# Patient Record
Sex: Female | Born: 1975 | Race: White | Hispanic: Yes | Marital: Single | Smoking: Never smoker
Health system: Southern US, Community
[De-identification: ages and names within clinical notes are randomized; demographics above are authoritative.]

## PROBLEM LIST (undated history)

## (undated) HISTORY — PX: TUBAL LIGATION: SHX77

---

## 2010-05-26 ENCOUNTER — Other Ambulatory Visit (HOSPITAL_COMMUNITY): Payer: Self-pay | Admitting: *Deleted

## 2010-05-26 DIAGNOSIS — O09529 Supervision of elderly multigravida, unspecified trimester: Secondary | ICD-10-CM

## 2010-06-03 ENCOUNTER — Ambulatory Visit (HOSPITAL_COMMUNITY): Payer: Self-pay

## 2010-06-09 ENCOUNTER — Ambulatory Visit (HOSPITAL_COMMUNITY)
Admission: RE | Admit: 2010-06-09 | Discharge: 2010-06-09 | Disposition: A | Discharge status: Home / Self Care | Payer: Medicaid Other | Source: Ambulatory Visit | Attending: Family Medicine | Admitting: Family Medicine

## 2010-06-09 DIAGNOSIS — O0930 Supervision of pregnancy with insufficient antenatal care, unspecified trimester: Secondary | ICD-10-CM | POA: Insufficient documentation

## 2010-06-09 DIAGNOSIS — O09529 Supervision of elderly multigravida, unspecified trimester: Secondary | ICD-10-CM | POA: Insufficient documentation

## 2015-01-15 ENCOUNTER — Other Ambulatory Visit: Payer: Self-pay | Admitting: Obstetrics and Gynecology

## 2015-01-15 ENCOUNTER — Other Ambulatory Visit (HOSPITAL_COMMUNITY): Payer: Self-pay | Admitting: Obstetrics and Gynecology

## 2015-01-15 DIAGNOSIS — Z1231 Encounter for screening mammogram for malignant neoplasm of breast: Secondary | ICD-10-CM

## 2015-01-30 ENCOUNTER — Ambulatory Visit (HOSPITAL_COMMUNITY)
Admission: RE | Admit: 2015-01-30 | Discharge: 2015-01-30 | Disposition: A | Discharge status: Home / Self Care | Payer: Medicaid Other | Source: Ambulatory Visit | Attending: Obstetrics and Gynecology | Admitting: Obstetrics and Gynecology

## 2015-01-30 ENCOUNTER — Ambulatory Visit: Payer: Medicaid Other

## 2015-08-26 ENCOUNTER — Other Ambulatory Visit (HOSPITAL_COMMUNITY): Payer: Self-pay | Admitting: *Deleted

## 2015-08-26 DIAGNOSIS — N6310 Unspecified lump in the right breast, unspecified quadrant: Secondary | ICD-10-CM

## 2015-09-04 ENCOUNTER — Encounter (HOSPITAL_COMMUNITY): Payer: Self-pay

## 2015-09-04 ENCOUNTER — Ambulatory Visit (HOSPITAL_COMMUNITY)
Admission: RE | Admit: 2015-09-04 | Discharge: 2015-09-04 | Disposition: A | Discharge status: Home / Self Care | Payer: Self-pay | Source: Ambulatory Visit | Attending: Obstetrics and Gynecology | Admitting: Obstetrics and Gynecology

## 2015-09-04 ENCOUNTER — Ambulatory Visit
Admission: RE | Admit: 2015-09-04 | Discharge: 2015-09-04 | Disposition: A | Discharge status: Home / Self Care | Payer: No Typology Code available for payment source | Source: Ambulatory Visit | Attending: Obstetrics and Gynecology | Admitting: Obstetrics and Gynecology

## 2015-09-04 VITALS — BP 112/64 | Temp 98.3°F | Ht <= 58 in | Wt 156.8 lb

## 2015-09-04 DIAGNOSIS — N6315 Unspecified lump in the right breast, overlapping quadrants: Secondary | ICD-10-CM

## 2015-09-04 DIAGNOSIS — Z1239 Encounter for other screening for malignant neoplasm of breast: Secondary | ICD-10-CM

## 2015-09-04 DIAGNOSIS — N6310 Unspecified lump in the right breast, unspecified quadrant: Secondary | ICD-10-CM

## 2015-09-04 NOTE — Patient Instructions (Signed)
Educational materials on self breast awareness given. Explained to Sue Potts that she did not need a Pap smear today due to last Pap smear was in February 2016 per patient. Let her know BCCCP will cover Pap smears every 3 years unless has a history of abnormal Pap smears. [facility] for diagnostic mamm[facility]. Appointment scheduled for Thursday, September 04, 2015 at 1400. Bushra Sermons verbalized understanding.  Jullia Mulligan, Kathaleen Maserhristine Poll, RN 2:34 PM

## 2015-09-04 NOTE — Progress Notes (Addendum)
Complaints of a right breast lump or clogged milk duct within the right breast x 2-3 months. Per patient causing a burning pain that comes and goes. Patient rates pain at a 4 out of 10.  Pap Smear:  Pap smear not completed today. Last Pap smear was in February 2016 and normal per patient. Per patient has a history of an abnormal Pap smear 11 years ago that cryotherapy was completed for follow up. Per patient all Pap smears have been normal since cryotherapy. Per patient has had at least three normal Pap smears since cryotherapy was completed. No Pap smear results are in EPIC.  Physical exam: Breasts Breasts symmetrical. No skin abnormalities bilateral breasts. No nipple retraction bilateral breasts. No nipple discharge bilateral breasts. Per patient still producing some breast milk. Patient stopped breastfeeding 3 months ago and had breasfed her child for 4 years. No lymphadenopathy. No lumps palpated left breast. Palpated a lump within the right breast at 6 o'clock 2 cm below the nipple. No complaints of pain or tenderness on exam. [facility]. Appointment scheduled for Thursday, September 04, 2015 at 1400.        Pelvic/Bimanual No Pap smear completed today since last Pap smear was in February 2016 per patient. Pap smear not indicated per BCCCP guidelines.   Smoking History: Patient has never smoked.  Patient Navigation: Patient education provided. [facility]. Spanish interpreter provided.  Used Spanish interpreter [employer] Mora from [employer].

## 2015-09-05 ENCOUNTER — Encounter (HOSPITAL_COMMUNITY): Payer: Self-pay | Admitting: *Deleted

## 2015-10-01 NOTE — Addendum Note (Signed)
Encounter addended by: Priscille Heidelberghristine P Otisha Spickler, RN on: 10/01/2015  3:44 PM<BR>     Documentation filed: Notes Section

## 2015-10-02 ENCOUNTER — Telehealth: Payer: Self-pay

## 2015-10-02 NOTE — Telephone Encounter (Signed)
Called per [name] interpreter and informed about WISEWOMAN Program and scheduled for 7/13 at 8:15 AM.

## 2015-10-15 ENCOUNTER — Telehealth: Payer: Self-pay

## 2015-10-15 NOTE — Telephone Encounter (Signed)
Called per Sue Potts to remind of WISEWOMAN appointment on 10/16/15 at 8:15AM and to be fasting. Patient stated that knew where [facility] is.

## 2015-10-16 ENCOUNTER — Ambulatory Visit (HOSPITAL_BASED_OUTPATIENT_CLINIC_OR_DEPARTMENT_OTHER): Payer: Self-pay

## 2015-10-16 ENCOUNTER — Other Ambulatory Visit: Payer: Self-pay

## 2015-10-16 VITALS — BP 120/80 | HR 78 | Temp 98.5°F | Resp 16 | Ht <= 58 in | Wt 155.0 lb

## 2015-10-16 DIAGNOSIS — Z0000 Encounter for general adult medical examination without abnormal findings: Secondary | ICD-10-CM

## 2015-10-16 LAB — LIPID PANEL
CHOL/HDL RATIO: 4.1 ratio (ref 0.0–4.4)
Cholesterol, Total: 164 mg/dL (ref 100–199)
HDL: 40 mg/dL (ref >39)
LDL CALC: 91 mg/dL (ref 0–99)
TRIGLYCERIDES: 165 mg/dL — AB (ref 0–149)
VLDL Cholesterol Cal: 33 mg/dL (ref 5–40)

## 2015-10-16 LAB — HEMOGLOBIN A1C
Est. average glucose Bld gHb Est-mCnc: 120 mg/dL
Hemoglobin A1c: 5.8 % — ABNORMAL HIGH (ref 4.8–5.6)

## 2015-10-16 NOTE — Patient Instructions (Signed)
Discussed health assessment with patient. Will try and incorporate more fruits and vegetables daily in her meals. She will be called with results of lab work and we will then discussed any further follow up the patient needs. Patient verbalized understanding.

## 2015-10-16 NOTE — Progress Notes (Signed)
[facility] accompanied by Sue Potts interprer and is currently a BCCCP patient effective 09/04/2015.   Clinical Measurements: Patient is 4 ft. 11 inches, weight 231.7 lbs, and BMI 46.9.   Medical History: Patient has no history of high cholesterol. Patient does not have a history of hypertension or diabetes. Patient does have a history of gestational diabetes. Per patient no diagnosed history of coronary heart disease, heart attack, heart failure, stroke/TIA, vascular disease or congenital heart defects.  Blood Pressure, Self-measurement: Patient states has no reason to check Blood pressure.  Nutrition Assessment: Patient stated that eats 1 fruit occasionally. Patient states she eats 1 servings of vegetables a couple of days a week. Per patient does not eat 3 or more ounces of whole grains daily. States that eats a lot of white rice.  Patient doesn't eat two or more servings of fish weekly. Patient states she does not drink more than 36 ounces or 450 calories of beverages with added sugars weekly. Patient states she drinks a lot of water. Patient stated she does not watch her salt intake but does not use a lot of salt.   Physical Activity Assessment: Patient stated she moves about the house cleaning and cooking. Patient stated has probably been doing 75 minutes of moderate exercise a week and no vigorous exercise.  Smoking Status: Patient stated that has never smoked and is not exposed to smoke.  Quality of Life Assessment: In assessing patient's physical quality of life she stated that out of the past 30 days that she has felt her health was good all days. Patient also stated that in the past 30 days that her mental health was good including stress, depression and problems with emotions for all days. Patient did state that out of the past 30 days she felt her physical or mental health had not kept her from doing her usual activities including self-care, work  or recreation for all days.   Plan: Lab work will be done today including a lipid panel and Hgb A1C. Will call lab results when they are finished. Patient will receive Health Coaching for risk reduction initially and then as patient wants or needs.  FIRST HEALTH COACH: During initial screening with Sue Potts interpreter present, wellness health coaching was started.  Nutrition: Discussed with patient that normally should eat 2 to 3 servings of fruits and 2 to 21/2 of vegetables a day with a serving being 1/2 cup or small piece. Informed patient that two servings of fish were recommended per week, being ones with omega threes. Listed the fishes with omega three's for patient.Discussed that should have 5 to 7 ounces of grains a day. Explained to patient that should eat fruits and vegetables high in fiber but 3 of her 5 to 7 grains of fiber should be whole grain. Examples of whole grains were given.   Informed about lowsugar intake which patient is doing. Discussed decreasing salt intake by mainly adding some while cook and none at table.  Physical Activity:  Patient was Informed that minimal moderate activity is 150 minutes a week or 75 minutes of vigorous. Discussed adding in some flexeibily and balance exercises.  PLAN: Will set goal when call lab results and do second health coaching. Will think about what goals may want to set.

## 2015-10-20 ENCOUNTER — Telehealth: Payer: Self-pay

## 2015-10-20 NOTE — Telephone Encounter (Signed)
Patient was called on 10/20/15 per Sue PantherBelen Potts interpreter to inform about lab work from 10/16/15.  LAB RESULTS :   Interpreter, Sue Potts informed patient: BMI 33.5, cholesterol- 164, HDL- 40, LDL- 91, triglycerides - 165, and HBG-A1C - 5.8.    RISK REDUCTION  HEALTH COACHING:Did risk reduction counseling concerning BMI, Triglycerides and A1C. Informed that normal BMI was 18 to 25 and patient is in overweight range. Discussed portion sizes and avoiding fatty food. Informed patient needs to stay away from drinks and food that have too much sugar and carbohydrates. Told her that needs to only eat 6 to 7 servings of starches a day. Emphasized that included tortilla, rice and beans. Informed by interpreter that a serving size was 1/2 cup or one 6 inch tortilla. Discussed that needed to eat less rice and would be better if was brown rice and that all starches that eats should be high fiber. Reminded her of eating more vegetable and fiber.  Asked if she would be interested in one on one health coaching since having trouble between sugar and starches. Appointment set for July 27 at 2:25 PM.  PLAN: Third health coach session in person. Have patient verbalize how needs to change eating habits.

## 2015-10-30 ENCOUNTER — Ambulatory Visit: Payer: Self-pay

## 2015-10-30 DIAGNOSIS — Z789 Other specified health status: Secondary | ICD-10-CM

## 2015-10-30 NOTE — Patient Instructions (Signed)
Patient will lose at least 25 pounds over the next 4 to 6 months. Will decrease amount of carbohydrates she eats.

## 2015-10-30 NOTE — Progress Notes (Signed)
THIRD HEALTH COACHING Patient returns today for Health Coaching. Interpreter Albertina Senegal present. Patient coming mainly because of Pre -diabetic A1C, triglycerides, and BMI 33.5(Obese).     HEALTH COACHING:  Patient and I went over lab results and looked at normal range. Went through [employer] about A1C, weight, BMI, carbohydrates, overweightness, and activity.We first discussed what carbohydrates were. Informed that they were mainly starches and sugar. That starches break down into sugars in the body. We then talked about portion sizes for each food group. We then looked at 1400 calorie diet and how many portions can have in each food group. Patient stated that mainly eats rice, beans and tortillas. Patient asked if could have peanut butter and jelly. Informed that a serving size of peanut better was a tablespoon and not to use much jelly. Remember that jelly has a lot of sugar and should use low sugar jelly or jams.Took cut out pictures of food to demonstrated how many servings could have a day of each food group.   Patient stated that main goal is to eat properly to take care of herself. Talked about main problems if developed diabetes and some with pre diabetes is peripheral neuropathy, Eye problems, heart problems, and kidney problems. Signs and symptoms of each were explained. Patient stated that she needed to be told that so that would take it serious.    Patient received handouts in Spanish and were reviewed on: A1C, BMI, Portions and serving sizes 1400 cal. Meal, Carbohydrate Counting,Tips for weight and keeping it off, Be Active Your Way, and Reading Food Labels. Patient also received a snack container, water bottle, cool/warm lunch bag, and a bag.

## 2016-08-27 ENCOUNTER — Other Ambulatory Visit: Payer: Self-pay | Admitting: Obstetrics and Gynecology

## 2016-08-27 DIAGNOSIS — Z1231 Encounter for screening mammogram for malignant neoplasm of breast: Secondary | ICD-10-CM

## 2016-09-09 ENCOUNTER — Ambulatory Visit (HOSPITAL_COMMUNITY)
Admission: RE | Admit: 2016-09-09 | Discharge: 2016-09-09 | Disposition: A | Discharge status: Home / Self Care | Payer: Self-pay | Source: Ambulatory Visit | Attending: Obstetrics and Gynecology | Admitting: Obstetrics and Gynecology

## 2016-09-09 ENCOUNTER — Encounter (HOSPITAL_COMMUNITY): Payer: Self-pay

## 2016-09-09 ENCOUNTER — Ambulatory Visit
Admission: RE | Admit: 2016-09-09 | Discharge: 2016-09-09 | Disposition: A | Discharge status: Home / Self Care | Payer: No Typology Code available for payment source | Source: Ambulatory Visit | Attending: Obstetrics and Gynecology | Admitting: Obstetrics and Gynecology

## 2016-09-09 VITALS — BP 110/70 | Ht <= 58 in | Wt 148.8 lb

## 2016-09-09 DIAGNOSIS — Z1239 Encounter for other screening for malignant neoplasm of breast: Secondary | ICD-10-CM

## 2016-09-09 DIAGNOSIS — Z1231 Encounter for screening mammogram for malignant neoplasm of breast: Secondary | ICD-10-CM

## 2016-09-09 NOTE — Patient Instructions (Signed)
Explained breast self awareness with Clatie Heinz. Patient did not need a Pap smear today due to last Pap smear was in February 2016 per patient. Let her know BCCCP will cover Pap smears every 3 years unless has a history of abnormal Pap smears. Reminded patient that her next Pap smear will be due in February 2019. Told patient she can schedule with BCCCP or go to one of the free cervical cancer screenings. Let patient know to call Sabrina to schedule. [facility] mamm[facility]. Appointment scheduled for Thursday, September 09, 2016 at 0910. Let patient know the Breast Center will follow up with her within the next couple weeks with results of mammogram by letter or phone. Narissa Stambaugh verbalized understanding.  Deshanna Kama, Kathaleen Maserhristine Poll, RN 10:08 AM

## 2016-09-09 NOTE — Progress Notes (Signed)
Complaints of occasional burning sensation right breast that has been occurring since quit breastfeeding over a year ago.   Pap Smear: Pap smear not completed today. Last Pap smear was in February 2016 and normal per patient. Per patient has a history of an abnormal Pap smear 12 years ago that cryotherapy was completed for follow up. Per patient all Pap smears have been normal since cryotherapy. Per patient has had at least three normal Pap smears since cryotherapy was completed. No Pap smear results are in EPIC.  Physical exam: Breasts Breasts symmetrical. No skin abnormalities bilateral breasts. No nipple retraction bilateral breasts. No nipple discharge bilateral breasts. No discharge expressed on exam. Per patient still producing some breast milk when expressed. Patient stopped breastfeeding over a year ago and had breasfed her child for 4 years. No lymphadenopathy. No lumps palpated bilateral breasts. [facility] a screening mamm[facility]. Appointment scheduled for Thursday, September 09, 2016 at 0910.      Pelvic/Bimanual No Pap smear completed today since last Pap smear was in February 2016 per patient. Pap smear not indicated per BCCCP guidelines.   Smoking History: Patient has never smoked.  Patient Navigation: Patient education provided. [facility]. Spanish interpreter provided.  Used Spanish interpreter [employer]anca Lindner from [facility].

## 2016-09-13 ENCOUNTER — Encounter (HOSPITAL_COMMUNITY): Payer: Self-pay | Admitting: *Deleted

## 2017-01-19 ENCOUNTER — Encounter (HOSPITAL_COMMUNITY): Payer: Self-pay

## 2017-04-20 ENCOUNTER — Other Ambulatory Visit (HOSPITAL_COMMUNITY): Payer: Self-pay | Admitting: *Deleted

## 2017-04-20 DIAGNOSIS — N644 Mastodynia: Secondary | ICD-10-CM

## 2017-05-05 ENCOUNTER — Encounter (HOSPITAL_COMMUNITY): Payer: Self-pay

## 2017-05-05 ENCOUNTER — Ambulatory Visit (HOSPITAL_COMMUNITY)
Admission: RE | Admit: 2017-05-05 | Discharge: 2017-05-05 | Disposition: A | Discharge status: Home / Self Care | Payer: No Typology Code available for payment source | Source: Ambulatory Visit | Attending: Obstetrics and Gynecology | Admitting: Obstetrics and Gynecology

## 2017-05-05 ENCOUNTER — Ambulatory Visit: Payer: No Typology Code available for payment source

## 2017-05-05 ENCOUNTER — Ambulatory Visit
Admission: RE | Admit: 2017-05-05 | Discharge: 2017-05-05 | Disposition: A | Discharge status: Home / Self Care | Payer: No Typology Code available for payment source | Source: Ambulatory Visit | Attending: Obstetrics and Gynecology | Admitting: Obstetrics and Gynecology

## 2017-05-05 VITALS — BP 118/64 | Wt 164.0 lb

## 2017-05-05 DIAGNOSIS — N644 Mastodynia: Secondary | ICD-10-CM

## 2017-05-05 DIAGNOSIS — Z1239 Encounter for other screening for malignant neoplasm of breast: Secondary | ICD-10-CM

## 2017-05-05 NOTE — Progress Notes (Signed)
Complaints of a burning sensation within the right breast that has been has been occurring since quit breastfeeding over a year and a half ago. Patient stated it stopped but started back a month ago. Patient stated the pain resolved 4 days ago after she decreased her caffeine intake.  Pap Smear: Pap smear not completed today. Last Pap smear was in February 2016 and normal per patient. Per patient has a history of an abnormal Pap smear 12 years ago that cryotherapy was completed for follow up. Per patient all Pap smears have been normal since cryotherapy. Per patient has had at least three normal Pap smears since cryotherapy was completed. Patient is scheduled for a Pap smear in BCCCP clinic on Tuesday, May 24, 2017 at 0915. No Pap smear results are in EPIC.  Physical exam: Breasts Breasts symmetrical. No skin abnormalities bilateral breasts. No nipple retraction bilateral breasts. No nipple discharge bilateral breasts. No discharge expressed on exam. No lymphadenopathy. No lumps palpated bilateral breasts. No complaints of pain or tenderness on exam. Since pain has resolved diagnostic mammogram is not indicated. Discussed with Dr. Mayford Knifeurner and she is agrees. Screening mammogram due in June 2019 unless clinically indicated prior.   Pelvic/Bimanual No Pap smear completed today since last Pap smear was in February 2016 per patient. Pap smear not indicated per BCCCP guidelines.  Smoking History: Patient has never smoked.  Patient Navigation: Patient education provided. [facility] to services provided [facility]. Spanish interpreter provided.  Used Spanish interpreter [employer]rika McReynolds from [employer].

## 2017-05-05 NOTE — Patient Instructions (Addendum)
Explained breast self awareness with Sue Potts. Patient did not need a Pap smear today due to last Pap smear was in February 2016 per patient. Let her know BCCCP will cover Pap smears every 3 years unless has a history of abnormal Pap smears. Reminded patient that [facility] Tuesday, May 24, 2017 at 0915. Screening mammogram recommended in June 2019 unless clinically indicated prior. Sue Potts verbalized understanding.  [name], Kathaleen Maserhristine Poll, RN 3:01 PM

## 2017-05-24 ENCOUNTER — Ambulatory Visit (HOSPITAL_COMMUNITY)
Admission: RE | Admit: 2017-05-24 | Discharge: 2017-05-24 | Disposition: A | Discharge status: Home / Self Care | Payer: No Typology Code available for payment source | Source: Ambulatory Visit | Attending: Obstetrics and Gynecology | Admitting: Obstetrics and Gynecology

## 2017-05-24 ENCOUNTER — Encounter (HOSPITAL_COMMUNITY): Payer: Self-pay

## 2017-05-24 VITALS — BP 108/60 | Wt 164.0 lb

## 2017-05-24 DIAGNOSIS — Z01419 Encounter for gynecological examination (general) (routine) without abnormal findings: Secondary | ICD-10-CM

## 2017-05-24 NOTE — Patient Instructions (Signed)
Explained to Kindred HealthcareDelsi Potts that her next Pap smear will be due based on the result of today's Pap smear. Reminded patient that her next screening mammogram is due in June 2019 and to call BCCCP to schedule. Let patient know will follow up with her within the next couple weeks with results of Pap smear and wet prep by phone. Sue Potts verbalized understanding.  [name], Kathaleen Maserhristine Poll, RN 9:25 AM

## 2017-05-24 NOTE — Progress Notes (Signed)
No complaints today.   Pap Smear: Pap smear completed today. Last Pap smear was in February 2016 and normal per patient. Per patient has a history of an abnormal Pap smear 12years ago that cryotherapy was completed for follow up. Per patient all Pap smears have been normal since cryotherapy. Per patient has had at least three normal Pap smears since cryotherapy was completed. No Pap smear results are in EPIC.  Pelvic/Bimanual   Ext Genitalia No lesions, no swelling and no discharge observed on external genitalia.         Vagina Vagina pink and normal texture. No lesions and thick white discharge observed in vagina. Wet prep completed          Cervix Cervix is present. Cervix pink and of normal texture. Thick white discharge observed on cervix.    Uterus Uterus is present and palpable. Uterus in normal position and normal size.        Adnexae Bilateral ovaries present and palpable. No tenderness on palpation.          Rectovaginal No rectal exam completed today since patient had no rectal complaints. No skin abnormalities observed on exam.    Smoking History: Patient has never smoked.  Patient Navigation: Patient education provided. Access to services provided [facility]. Spanish interpreter provided.  Used Spanish interpreter [employer]rika McReynolds from [employer].

## 2017-05-24 NOTE — Addendum Note (Signed)
Encounter addended by: Lynnell DikeHolland, Amauria Younts H, LPN on: 1/61/09602/19/2019 10:08 AM  Actions taken: Order list changed

## 2017-05-26 LAB — CYTOLOGY - PAP
Bacterial vaginitis: POSITIVE — AB
Candida vaginitis: NEGATIVE
DIAGNOSIS: NEGATIVE
HPV: NOT DETECTED
Trichomonas: NEGATIVE

## 2017-05-27 ENCOUNTER — Other Ambulatory Visit (HOSPITAL_COMMUNITY): Payer: Self-pay

## 2017-05-27 ENCOUNTER — Telehealth (HOSPITAL_COMMUNITY): Payer: Self-pay

## 2017-05-27 MED ORDER — METRONIDAZOLE 500 MG PO TABS: 500.0000 mg | ORAL_TABLET | Freq: Two times a day (BID) | ORAL | 0 refills | Status: DC

## 2017-05-27 NOTE — Telephone Encounter (Signed)
Interpreter Delorise RoyalsJulie Sowell  Phoned patient regarding Pap smear results. Informed patient that her pap smear was normal with negative HPV. Wet pre showed bacterial vaginosis, patient informed. Flagyl 500mg  one tablet two times a day for seven days prescribed. Patient voiced understanding.

## 2017-05-31 ENCOUNTER — Other Ambulatory Visit (HOSPITAL_COMMUNITY): Payer: Self-pay

## 2017-05-31 MED ORDER — METRONIDAZOLE 500 MG PO TABS: 500.0000 mg | ORAL_TABLET | Freq: Two times a day (BID) | ORAL | 0 refills | Status: DC

## 2017-12-26 ENCOUNTER — Other Ambulatory Visit: Payer: Self-pay | Admitting: Obstetrics and Gynecology

## 2017-12-26 DIAGNOSIS — Z1231 Encounter for screening mammogram for malignant neoplasm of breast: Secondary | ICD-10-CM

## 2018-03-07 ENCOUNTER — Ambulatory Visit (HOSPITAL_COMMUNITY)
Admission: RE | Admit: 2018-03-07 | Discharge: 2018-03-07 | Disposition: A | Discharge status: Home / Self Care | Payer: No Typology Code available for payment source | Source: Ambulatory Visit | Attending: Obstetrics and Gynecology | Admitting: Obstetrics and Gynecology

## 2018-03-07 ENCOUNTER — Encounter (HOSPITAL_COMMUNITY): Payer: Self-pay | Admitting: *Deleted

## 2018-03-07 ENCOUNTER — Encounter (HOSPITAL_COMMUNITY): Payer: Self-pay

## 2018-03-07 ENCOUNTER — Ambulatory Visit
Admission: RE | Admit: 2018-03-07 | Discharge: 2018-03-07 | Disposition: A | Discharge status: Home / Self Care | Payer: Self-pay | Source: Ambulatory Visit | Attending: Obstetrics and Gynecology | Admitting: Obstetrics and Gynecology

## 2018-03-07 VITALS — BP 114/72 | Wt 164.0 lb

## 2018-03-07 DIAGNOSIS — Z1231 Encounter for screening mammogram for malignant neoplasm of breast: Secondary | ICD-10-CM

## 2018-03-07 DIAGNOSIS — Z1239 Encounter for other screening for malignant neoplasm of breast: Secondary | ICD-10-CM

## 2018-03-07 NOTE — Progress Notes (Addendum)
No complaints today.   Pap Smear: Pap smear not completed today. [facility] and normal with negative HPV. Per patient has a history of an abnormal Pap smear 12years ago that cryotherapy was completed for follow up. Per patient all Pap smears have been normal since cryotherapy. Per patient has had at least three normal Pap smears since cryotherapy was completed.Last Pap smear result is in EPIC.  Physical exam: Breasts Breasts symmetrical. No skin abnormalities bilateral breasts. No nipple retraction bilateral breasts. No nipple discharge bilateral breasts. No lymphadenopathy. No lumps palpated bilateral breasts. No complaints of pain or tenderness on exam. [facility] mamm[facility]. Appointment scheduled for Tuesday, March 07, 2018 at 1110.        Pelvic/Bimanual No Pap smear completed today since last Pap smear and HPV typing was 05/24/2017. Pap smear not indicated per BCCCP guidelines.   Smoking History: Patient has never smoked.  Patient Navigation: Patient education provided. Access to services provided [facility]. Spanish interpreter provided.   Breast and Cervical Cancer Risk Assessment: Patient has a family history of a paternal aunt having breast cancer. Patient has no known genetic mutations or history of radiation treatment to the chest before age 44. Per patient has no history of cervical dysplasia. Patient has no history of being immunocompromised or DES exposure in-utero.  Risk Assessment    Risk Scores      03/07/2018   Last edited by: Lynnell DikeHolland, Sabrina H, LPN   5-year risk: 0.4 %   Lifetime risk: 5.6 %         Used Spanish interpreter Natale LayErika McReynolds from [place].

## 2018-03-07 NOTE — Addendum Note (Signed)
Encounter addended by: Priscille HeidelbergBrannock, Twanda Stakes P, RN on: 03/07/2018 10:53 AM  Actions taken: Sign clinical note

## 2018-03-07 NOTE — Patient Instructions (Signed)
Explained breast self awareness with [name]. Patient did not need a Pap smear today due to last Pap smear and HPV typing was 05/24/2017. Let her know BCCCP will cover Pap smears and HPV typing every 5 years unless has a history of abnormal Pap smears. [facility] for a screening mamm[facility]. Appointment scheduled for Tuesday, March 07, 2018 at 1110. Patient aware of appointment and will be there. Let patient know the Breast Center will follow up with her within the next couple weeks with results of mammogram by letter or phone. Sue Potts verbalized understanding.  Haru Shaff, Kathaleen Maserhristine Poll, RN 9:32 AM

## 2019-04-20 ENCOUNTER — Other Ambulatory Visit (HOSPITAL_COMMUNITY): Payer: Self-pay | Admitting: *Deleted

## 2019-04-20 DIAGNOSIS — N644 Mastodynia: Secondary | ICD-10-CM

## 2019-05-31 ENCOUNTER — Other Ambulatory Visit: Payer: Self-pay

## 2019-05-31 ENCOUNTER — Ambulatory Visit: Payer: No Typology Code available for payment source

## 2019-05-31 ENCOUNTER — Ambulatory Visit: Payer: Self-pay | Admitting: Women's Health

## 2019-05-31 ENCOUNTER — Encounter: Payer: Self-pay | Admitting: Women's Health

## 2019-05-31 VITALS — BP 115/78 | Temp 97.5°F | Wt 160.0 lb

## 2019-05-31 DIAGNOSIS — Z1239 Encounter for other screening for malignant neoplasm of breast: Secondary | ICD-10-CM

## 2019-05-31 NOTE — Progress Notes (Signed)
[name] is a 44 y.o. female who presents to [facility] clinic today with complaint of a burning sensation and needle sensation in the right nipple area.  Pt denies any lumps or discharge.   Pap Smear: Pap not smear completed today. Last Pap smear was 05/24/2017 at [facility] and was normal. Per patient has history of an abnormal Pap smear. Last Pap smear result is available in Epic.   Physical exam: Breasts Breasts symmetrical. No skin abnormalities bilateral breasts. No nipple retraction bilateral breasts. No nipple discharge bilateral breasts. No lymphadenopathy. No lumps palpated bilateral breasts.       Pelvic/Bimanual Pap is not indicated today    Smoking History: Patient has never smoked not referred to quit line.    Patient Navigation: Patient education provided. Access to services provided for patient through [facility] program. Spanish interpreter provided. No transportation provided   Colorectal Cancer Screening: Per patient has never had colonoscopy completed No complaints today.    Breast and Cervical Cancer Risk Assessment: Patient has family history of breast cancer (paternal great aunt), no known genetic mutations, or radiation treatment to the chest before age 27. Patient does not have history of cervical dysplasia, immunocompromised, or DES exposure in-utero.  Risk Assessment    No risk assessment data for the current encounter   Risk Scores      03/07/2018   Last edited by: Lynnell Dike, LPN   5-year risk: 0.4 %   Lifetime risk: 5.6 %          A: BCCCP exam without pap smear Complaint of right nipple burning/needle sensation.  P: Referred patient to the [facility] of [facility] for a diagnostic mammogram. Appointment scheduled 05/31/2019.  Marylen Ponto, NP 05/31/2019 9:38 AM

## 2019-05-31 NOTE — Patient Instructions (Signed)
Breast Self-Awareness Breast self-awareness means being familiar with how your breasts look and feel. It involves checking your breasts regularly and reporting any changes to your health care provider. Practicing breast self-awareness is important. Sometimes changes may not be harmful (are benign), but sometimes a change in your breasts can be a sign of a serious medical problem. It is important to learn how to do this procedure correctly so that you can catch problems early, when treatment is more likely to be successful. All women should practice breast self-awareness, including women who have had breast implants. What you need:  A mirror.  A well-lit room. How to do a breast self-exam A breast self-exam is one way to learn what is normal for your breasts and whether your breasts are changing. To do a breast self-exam: Look for changes  1. Remove all the clothing above your waist. 2. Stand in front of a mirror in a room with good lighting. 3. Put your hands on your hips. 4. Push your hands firmly downward. 5. Compare your breasts in the mirror. Look for differences between them (asymmetry), such as: ? Differences in shape. ? Differences in size. ? Puckers, dips, and bumps in one breast and not the other. 6. Look at each breast for changes in the skin, such as: ? Redness. ? Scaly areas. 7. Look for changes in your nipples, such as: ? Discharge. ? Bleeding. ? Dimpling. ? Redness. ? A change in position. Feel for changes Carefully feel your breasts for lumps and changes. It is best to do this while lying on your back on the floor, and again while sitting or standing in the tub or shower with soapy water on your skin. Feel each breast in the following way: 1. Place the arm on the side of the breast you are examining above your head. 2. Feel your breast with the other hand. 3. Start in the nipple area and make -inch (2 cm) overlapping circles to feel your breast. Use the pads of your  three middle fingers to do this. Apply light pressure, then medium pressure, then firm pressure. The light pressure will allow you to feel the tissue closest to the skin. The medium pressure will allow you to feel the tissue that is a little deeper. The firm pressure will allow you to feel the tissue close to the ribs. 4. Continue the overlapping circles, moving downward over the breast until you feel your ribs below your breast. 5. Move one finger-width toward the center of the body. Continue to use the -inch (2 cm) overlapping circles to feel your breast as you move slowly up toward your collarbone. 6. Continue the up-and-down exam using all three pressures until you reach your armpit.  Write down what you find Writing down what you find can help you remember what to discuss with your health care provider. Write down:  What is normal for each breast.  Any changes that you find in each breast, including: ? The kind of changes you find. ? Any pain or tenderness. ? Size and location of any lumps.  Where you are in your menstrual cycle, if you are still menstruating. General tips and recommendations  Examine your breasts every month.  If you are breastfeeding, the best time to examine your breasts is after a feeding or after using a breast pump.  If you menstruate, the best time to examine your breasts is 5-7 days after your period. Breasts are generally lumpier during menstrual periods, and it may   be more difficult to notice changes.  With time and practice, you will become more familiar with the variations in your breasts and more comfortable with the exam. Contact a health care provider if you:  See a change in the shape or size of your breasts or nipples.  See a change in the skin of your breast or nipples, such as a reddened or scaly area.  Have unusual discharge from your nipples.  Find a lump or thick area that was not there before.  Have pain in your breasts.  Have any  concerns related to your breast health. Summary  Breast self-awareness includes looking for physical changes in your breasts, as well as feeling for any changes within your breasts.  Breast self-awareness should be performed in front of a mirror in a well-lit room.  You should examine your breasts every month. If you menstruate, the best time to examine your breasts is 5-7 days after your menstrual period.  Let your health care provider know of any changes you notice in your breasts, including changes in size, changes on the skin, pain or tenderness, or unusual fluid from your nipples. This information is not intended to replace advice given to you by your health care provider. Make sure you discuss any questions you have with your health care provider. Document Revised: 11/08/2017 Document Reviewed: 11/08/2017 Elsevier Patient Education  2020 Elsevier Inc.  

## 2019-06-27 ENCOUNTER — Telehealth: Payer: Self-pay

## 2019-06-27 NOTE — Telephone Encounter (Signed)
Error

## 2019-07-02 ENCOUNTER — Ambulatory Visit: Payer: No Typology Code available for payment source

## 2019-07-02 ENCOUNTER — Ambulatory Visit
Admission: RE | Admit: 2019-07-02 | Discharge: 2019-07-02 | Disposition: A | Discharge status: Home / Self Care | Payer: No Typology Code available for payment source | Source: Ambulatory Visit | Attending: Obstetrics and Gynecology | Admitting: Obstetrics and Gynecology

## 2019-07-02 ENCOUNTER — Other Ambulatory Visit: Payer: Self-pay

## 2019-07-02 DIAGNOSIS — N644 Mastodynia: Secondary | ICD-10-CM

## 2020-06-20 ENCOUNTER — Other Ambulatory Visit: Payer: Self-pay | Admitting: Obstetrics and Gynecology

## 2020-06-20 DIAGNOSIS — Z1231 Encounter for screening mammogram for malignant neoplasm of breast: Secondary | ICD-10-CM

## 2020-07-08 ENCOUNTER — Other Ambulatory Visit: Payer: Self-pay

## 2020-07-08 ENCOUNTER — Ambulatory Visit: Payer: Self-pay | Admitting: *Deleted

## 2020-07-08 ENCOUNTER — Encounter (INDEPENDENT_AMBULATORY_CARE_PROVIDER_SITE_OTHER): Payer: Self-pay

## 2020-07-08 ENCOUNTER — Ambulatory Visit
Admission: RE | Admit: 2020-07-08 | Discharge: 2020-07-08 | Disposition: A | Discharge status: Home / Self Care | Payer: No Typology Code available for payment source | Source: Ambulatory Visit | Attending: Obstetrics and Gynecology | Admitting: Obstetrics and Gynecology

## 2020-07-08 VITALS — BP 130/88 | Wt 158.5 lb

## 2020-07-08 DIAGNOSIS — Z1231 Encounter for screening mammogram for malignant neoplasm of breast: Secondary | ICD-10-CM

## 2020-07-08 DIAGNOSIS — Z1239 Encounter for other screening for malignant neoplasm of breast: Secondary | ICD-10-CM

## 2020-07-08 NOTE — Patient Instructions (Signed)
Explained breast self awareness with Mattia Louison. Patient did not need a Pap smear today due to last Pap smear and HPV typing was 05/24/2017. Let her know [facility] will cover Pap smears and HPV typing every 5 years unless has a history of abnormal Pap smears. Referred patient to the [facility] for a screening mammogram on mobile unit. Appointment scheduled Tuesday, July 08, 2020 at 1410. Patient escorted to the mobile unit following [facility] appointment for her screening mammogram. Let patient know the Breast Center will follow up with her within the next couple weeks with results of her mammogram by letter or phone. Sue Potts verbalized understanding.  Demiana Crumbley, [name], RN 1:38 PM

## 2020-07-08 NOTE — Progress Notes (Signed)
[name] is a 45 y.o. female who presents to [facility] clinic today with no complaints.    Pap Smear: Pap not smear completed today. Pap smear not completed today. Last Pap smear was 05/24/2017 at [facility] and normal with negative HPV. Per patient has a history of an abnormal Pap smear 14years ago that cryotherapy was completed for follow up. Per patient all Pap smears have been normal since cryotherapy. Per patient has had at least three normal Pap smears since cryotherapy was completed.Last Pap smear result is available in Epic.   Physical exam: Breasts Breasts symmetrical. No skin abnormalities bilateral breasts. No nipple retraction bilateral breasts. No nipple discharge bilateral breasts. No lymphadenopathy. No lumps palpated bilateral breasts. No complaints of pain or tenderness on exam.  MS DIGITAL SCREENING BILATERAL  Result Date: 09/09/2016 CLINICAL DATA:  Screening. EXAM: DIGITAL SCREENING BILATERAL MAMMOGRAM WITH CAD COMPARISON:  Previous exam(s). ACR Breast Density Category b: There are scattered areas of fibroglandular density. FINDINGS: There are no findings suspicious for malignancy. Images were processed with CAD. IMPRESSION: No mammographic evidence of malignancy. A result letter of this screening mammogram will be mailed directly to the patient. RECOMMENDATION: Screening mammogram in one year. (Code:SM-B-01Y) BI-RADS CATEGORY  1: Negative. Electronically Signed   By: Frederico Hamman M.D.   On: 09/09/2016 16:43   MM DIAG BREAST TOMO BILATERAL  Result Date: 09/04/2015 CLINICAL DATA:  44 year old with focal pain in the lower outer periareolar right breast. Baseline mammogram. EXAM: 2D DIGITAL DIAGNOSTIC BILATERAL MAMMOGRAM WITH CAD AND ADJUNCT TOMO ULTRASOUND RIGHT BREAST COMPARISON:  None. ACR Breast Density Category b: There are scattered areas of fibroglandular density. FINDINGS: Standard 2D and tomosynthesis CC and MLO views of both breasts and a standard 2D and  tomosynthesis spot tangential view in the area of focal pain in the right breast were obtained. No mammographic abnormality in the area of focal pain in the lower outer periareolar right breast. No findings suspicious for malignancy in either breast. Mammographic images were processed with CAD. On physical exam, there is no palpable mass in the lower outer periareolar right breast. Targeted right breast ultrasound is performed, showing normal fibrofatty and scattered fibroglandular tissue in the lower outer periareolar right breast in the area of focal pain. No cyst, solid mass or abnormal acoustic shadowing is identified. IMPRESSION: 1. No mammographic or sonographic evidence of malignancy, right breast. 2. No mammographic evidence of malignancy, left breast. RECOMMENDATION: Screening mammogram in one year.(Code:SM-B-01Y) I have discussed the findings and recommendations with the patient. Communication with the patient was achieved with the assistance of a certified interpreter. Results were also provided in writing at the conclusion of the visit. If applicable, a reminder letter will be sent to the patient regarding the next appointment. BI-RADS CATEGORY  1: Negative. Electronically Signed   By: Hulan Saas M.D.   On: 09/04/2015 16:29   MS DIGITAL SCREENING TOMO BILATERAL  Result Date: 03/07/2018 CLINICAL DATA:  Screening. EXAM: DIGITAL SCREENING BILATERAL MAMMOGRAM WITH TOMO AND CAD COMPARISON:  Previous exam(s). ACR Breast Density Category b: There are scattered areas of fibroglandular density. FINDINGS: There are no findings suspicious for malignancy. Images were processed with CAD. IMPRESSION: No mammographic evidence of malignancy. A result letter of this screening mammogram will be mailed directly to the patient. RECOMMENDATION: Screening mammogram in one year. (Code:SM-B-01Y) BI-RADS CATEGORY  1: Negative. Electronically Signed   By: Hulan Saas M.D.   On: 03/07/2018 12:53   MS DIGITAL DIAG  TOMO BILAT  Result Date: 07/02/2019 CLINICAL DATA:  45 year old female presenting for evaluation of diffuse bilateral breast pain. The patient states that her pain has resolved. She is asymptomatic today. EXAM: DIGITAL DIAGNOSTIC BILATERAL MAMMOGRAM WITH CAD AND TOMO COMPARISON:  Previous exam(s). ACR Breast Density Category b: There are scattered areas of fibroglandular density. FINDINGS: No suspicious calcifications, masses or areas of distortion are seen in the bilateral breasts. Mammographic images were processed with CAD. IMPRESSION: 1.  No mammographic evidence of malignancy in the bilateral breasts. 2. No mammographic findings to explain the patient's prior bilateral breast pain. RECOMMENDATION: Screening mammogram in one year.(Code:SM-B-01Y) I have discussed the findings and recommendations with the patient. If applicable, a reminder letter will be sent to the patient regarding the next appointment. BI-RADS CATEGORY  1: Negative. Electronically Signed   By: Frederico Hamman M.D.   On: 07/02/2019 11:28   Pelvic/Bimanual Pap is not indicated today per BCCCP guidelines.   Smoking History: Patient has never smoked.   Patient Navigation: Patient education provided. Access to services provided for patient through [place] program. Spanish interpreter Natale Lay from [facility] provided.   Colorectal Cancer Screening: Per patient has never had colonoscopy completed. No complaints today.    Breast and Cervical Cancer Risk Assessment: Per patient does not have family history of breast cancer, known genetic mutations, or radiation treatment to the chest before age 66. Per patient has history of cervical dysplasia. Patient has no history of being immunocompromised or DES exposure in-utero.  Risk Assessment    Risk Scores      07/08/2020 07/02/2019   Last edited by: Meryl Dare, CMA [place]   5-year risk: 0.5 % 0.5 %   Lifetime risk: 5.5 % 5.6 %          A: No  complaints.  P: Referred patient to the [facility] for a screening mammogram on mobile unit. Appointment scheduled Tuesday, July 08, 2020 at 1410.  Priscille Heidelberg, RN 07/08/2020 1:38 PM

## 2020-08-13 ENCOUNTER — Other Ambulatory Visit: Payer: Self-pay

## 2020-08-13 ENCOUNTER — Inpatient Hospital Stay: Payer: Self-pay | Attending: Obstetrics and Gynecology | Admitting: *Deleted

## 2020-08-13 VITALS — BP 124/92 | Ht <= 58 in | Wt 151.8 lb

## 2020-08-13 DIAGNOSIS — Z0000 Encounter for general adult medical examination without abnormal findings: Secondary | ICD-10-CM

## 2020-08-13 NOTE — Progress Notes (Signed)
[facility] initial screening   Interpreter- Natale Lay, [employer]   Clinical Measurement: There were no vitals filed for this visit. Fasting Labs Drawn Today, will review with patient when they result.   Medical History:  Patient states that she does not know if she has high cholesterol,  high blood pressure or diabetes.  Medications:  Patient states that she does not take medication to lower cholesterol, blood pressure or blood sugar.  Patient does not take an aspirin a day to help prevent a heart attack or stroke.   Blood pressure, self measurement:  :  Patient states that she does not measure blood pressure from home. She checks her blood pressure N/A. She shares her readings with a health care provider: N/A.   Nutrition: Patient states that on average she eats 0 cups of fruit and 0 cups of vegetables per day. Patient states that she does not eat fish at least 2 times per week. Patient eats less than half servings of whole grains. Patient drinks less than 36 ounces of beverages with added sugar weekly: no. Patient is currently watching sodium or salt intake: yes. In the past 7 days patient has consumed drinks containing alcohol on 0 days. On a day that patient consumes drinks containing alcohol on average 0 drinks are consumed.      Physical activity:  Patient states that she gets 120 minutes of moderate and 0 minutes of vigorous physical activity each week.  Smoking status:  Patient states that she has has never smoked .   Quality of life:  Over the past 2 weeks patient states that she had little interest or pleasure in doing things: not at all. She has been feeling down, depressed or hopeless:not at all.    Risk reduction and counseling:   Health Coaching: Spoke with patient in detail about diet and daily recommendations for the different food groups. Explained that the daily recommendation is for 2 cups of fruit and 3 cups of vegetables. Explained to patient how heart healthy fish can  help with cholesterol and heart health. Gave recommendations for salmon, tuna, mackerel, sardines or sea bass to try to add into diet. Explained to patient what whole grains are and how they can be beneficial to heart health. Patient currently consumes oatmeal. Gave recommendations for other whole grains that she can try to add into diet such as whole wheat bread or pasta, whole grain cereals or brown rice. Spoke with patient about reducing the amount of beverages with added sugars that she consumes weekly. Patient averages at least one glass of sweet tea daily. Encouraged patient to continue watching the amount of salt that she consumes. Patient is currently walking about 5 days a week for about 20-30 minutes at a time. Encouraged patient to continue to try and get at least 20 minutes of physical activity daily.    Navigation:  I will notify patient of lab results.  Patient is aware of 2 more health coaching sessions and a follow up. Will refer patient to Internal Medicine for elevated blood pressure. Will call patient with appointment information once appointment has been scheduled.   Time: 60 minutes

## 2020-08-14 ENCOUNTER — Telehealth: Payer: Self-pay

## 2020-08-14 LAB — LIPID PANEL
Chol/HDL Ratio: 4.5 ratio — ABNORMAL HIGH (ref 0.0–4.4)
Cholesterol, Total: 215 mg/dL — ABNORMAL HIGH (ref 100–199)
HDL: 48 mg/dL (ref >39)
LDL Chol Calc (NIH): 138 mg/dL — ABNORMAL HIGH (ref 0–99)
Triglycerides: 161 mg/dL — ABNORMAL HIGH (ref 0–149)
VLDL Cholesterol Cal: 29 mg/dL (ref 5–40)

## 2020-08-14 LAB — HEMOGLOBIN A1C
Est. average glucose Bld gHb Est-mCnc: 272 mg/dL
Hgb A1c MFr Bld: 11.1 % — ABNORMAL HIGH (ref 4.8–5.6)

## 2020-08-14 LAB — GLUCOSE, RANDOM: Glucose: 343 mg/dL — ABNORMAL HIGH (ref 65–99)

## 2020-08-14 NOTE — Telephone Encounter (Signed)
Called patient via Spanish Interpreter [phone] to give lab results from the [facility] program. Was not able to go over all of the results with patient. Will call back today at 3:15 pm.

## 2020-08-14 NOTE — Telephone Encounter (Signed)
Health coaching 2   interpreter-[employer], #[id number]   Labs- 215 cholesterol, 138 LDL cholesterol, 161 triglycerides, 48 HDL cholesterol, 11.1 hemoglobin A1C, 343 mean plasma glucose. Patient understands and is aware of her lab results.   Goals-  1. Reduce the amount of fried and fatty foods consumed. 2. Reduce the amount of red meats consumed. Substitute for lean protein like chicken and fish. 3. Reduce the amounts of sweets and sugars consumed in both food and drinks. 4. Reduce the amounts of carbs consumed.  Spoke in detail with patient regarding results. Patient stated that she was not fasting for blood work and that she had consumed food prior to labs. Explained to patient that the hemoglobin A1C looks back over the past 3 months at glucose levels. Patient wanted to recheck levels. Explained to patient that the hemoglobin A1C can only be checked every 3 months and that if the doctor wants to recheck glucose that can be done during follow-up visit. Encouraged patient to ask the provider she sees next week any questions that she may have.    Navigation:  Patient is aware of 1 more health coaching sessions and a follow up. Patient is scheduled for follow-up visit with Internal Medicine on Aug 19, 2020 @ 9:45 am for elevated labs and blood pressure.   Time- 27 minutes

## 2020-08-19 ENCOUNTER — Ambulatory Visit (INDEPENDENT_AMBULATORY_CARE_PROVIDER_SITE_OTHER): Payer: Self-pay | Admitting: Internal Medicine

## 2020-08-19 ENCOUNTER — Encounter: Payer: Self-pay | Admitting: Internal Medicine

## 2020-08-19 ENCOUNTER — Other Ambulatory Visit: Payer: Self-pay

## 2020-08-19 DIAGNOSIS — E785 Hyperlipidemia, unspecified: Secondary | ICD-10-CM | POA: Insufficient documentation

## 2020-08-19 DIAGNOSIS — E119 Type 2 diabetes mellitus without complications: Secondary | ICD-10-CM

## 2020-08-19 DIAGNOSIS — E782 Mixed hyperlipidemia: Secondary | ICD-10-CM

## 2020-08-19 MED ORDER — METFORMIN HCL 500 MG PO TABS: 1000.0000 mg | ORAL_TABLET | Freq: Two times a day (BID) | ORAL | 2 refills | Status: DC

## 2020-08-19 NOTE — Patient Instructions (Signed)
It was nice seeing you today! Thank you for choosing [facility] for your Primary Care.    Today we talked about:   1. A new diagnosis of Diabetes: We will start treatment today with a medication called Metformin.   Let's follow up in 2 months to continue working on your diabetes together. ------------------------------------------------- Bennett Scrape verte hoy! Karl Pock por elegir [facility] para su [facility].   Hoy hablamos de:  1. Un nuevo diagnstico de Diabetes: Hoy iniciaremos tratamiento con un medicamento llamado Metformina.  Hagamos un seguimiento en 2 meses para seguir trabajando juntos en su diabetes.

## 2020-08-20 NOTE — Assessment & Plan Note (Signed)
Lab Results  Component Value Date   CHOL 215 (H) 08/13/2020   HDL 48 08/13/2020   LDLCALC 138 (H) 08/13/2020   TRIG 161 (H) 08/13/2020   CHOLHDL 4.5 (H) 08/13/2020   Low ASCVD risk score of 2.9% at this time, however given patient is over the age of 52 and diabetic, moderate intensity statin is indicated.  At this time, patient is very hesitant towards starting medications and so we will start treating her diabetes first.   -At subsequent appointments, plan to add a moderate intensity statin.

## 2020-08-20 NOTE — Assessment & Plan Note (Signed)
Lab Results  Component Value Date   HGBA1C 11.1 (H) 08/13/2020   Sue Potts states she is here today to discuss her recent lab work.  She was told that it may indicate diabetes.  She is very skeptical about this and would like her lab work repeated.  We discussed if she is experiencing any symptoms at this time and she endorses only mild polydipsia but denies any dizziness, polyuria, nausea, vomiting, abdominal pain.  She notes that there is no one in her family with a history of diabetes but this is something that her children were concerned she was developing given weight gain and lack of physical activity.  We discussed extensively her lab results including a random CBG over 200 and A1c of 11 confirms diabetes and repeat labs are not required at this time.  Patient is extremely upset about this and teary during her appointment.  Assessment/plan: Given that patient is relatively asymptomatic, will escalate treatment slowly, especially given her reluctance with this appointment and trouble accepting her diagnosis.  We will start with metformin with slow titration over the next month.  I suspect she will eventually need additional agents; could consider addition of an SGLT2 to her metformin.  She adamantly denies willingness to try injectables at this time.  -Educational information on diabetes provided -Metformin, goal dose of 1000 mg twice daily; titration instructions provided -Encourage starting daily exercise and working on decreasing carb intake -77-month follow-up

## 2020-08-20 NOTE — Progress Notes (Signed)
   CC: Abnormal lab results   HPI:  Sue Potts is a 45 y.o. with a PMHx as listed below who presents to the clinic for abnormal lab results.   Please see the Encounters tab for problem-based Assessment & Plan regarding status of patient's acute and chronic conditions.  Past Medical History:  No significant past medical history.   Past Surgical History:  N/A  Family History:  No family history of diabetes or heart disease.   Social History:  Lives in [place] with her 2 sons Denies any alcohol, drug or tobacco use.   Review of Systems: Review of Systems  Constitutional: Negative for chills, fever, malaise/fatigue and weight loss.  HENT: Negative for congestion and sore throat.   Respiratory: Negative for cough, sputum production and shortness of breath.   Cardiovascular: Negative for chest pain, palpitations and leg swelling.  Gastrointestinal: Negative for abdominal pain, blood in stool, constipation, diarrhea, nausea and vomiting.  Genitourinary: Negative for dysuria, frequency, hematuria and urgency.  Musculoskeletal: Negative for joint pain and myalgias.  Skin: Negative for itching and rash.  Neurological: Negative for dizziness and headaches.  Endo/Heme/Allergies: Positive for polydipsia.    Physical Exam:  Vitals:   08/19/20 1020  BP: (!) 142/93  Pulse: (!) 106  Temp: 98.1 F (36.7 C)  TempSrc: Oral  SpO2: 97%  Weight: 151 lb 11.2 oz (68.8 kg)  Height: 5' (1.524 m)   Physical Exam Vitals and nursing note reviewed.  Constitutional:      General: She is not in acute distress.    Appearance: She is normal weight.  HENT:     Head: Normocephalic and atraumatic.     Mouth/Throat:     Mouth: Mucous membranes are moist.     Pharynx: Oropharynx is clear.  Eyes:     Extraocular Movements: Extraocular movements intact.     Conjunctiva/sclera: Conjunctivae normal.  Cardiovascular:     Rate and Rhythm: Normal rate and regular rhythm.     Heart sounds: No  murmur heard.   Pulmonary:     Effort: Pulmonary effort is normal. No respiratory distress.     Breath sounds: Normal breath sounds. No wheezing, rhonchi or rales.  Abdominal:     General: Bowel sounds are normal. There is no distension.     Palpations: Abdomen is soft.     Tenderness: There is no abdominal tenderness. There is no guarding.  Musculoskeletal:     Right lower leg: No edema.     Left lower leg: No edema.  Skin:    General: Skin is warm and dry.  Neurological:     General: No focal deficit present.     Mental Status: She is alert and oriented to person, place, and time. Mental status is at baseline.  Psychiatric:        Mood and Affect: Mood normal.        Behavior: Behavior normal.        Thought Content: Thought content normal.        Judgment: Judgment normal.     Assessment & Plan:   See Encounters Tab for problem based charting.  Patient discussed with Dr. Mikey Bussing

## 2020-08-22 NOTE — Progress Notes (Signed)
Internal Medicine Clinic Attending  Case discussed with Dr. Basaraba  At the time of the visit.  We reviewed the resident's history and exam and pertinent patient test results.  I agree with the assessment, diagnosis, and plan of care documented in the resident's note.  

## 2020-09-04 ENCOUNTER — Ambulatory Visit: Payer: No Typology Code available for payment source

## 2020-10-22 ENCOUNTER — Encounter: Payer: No Typology Code available for payment source | Admitting: Internal Medicine

## 2021-06-08 ENCOUNTER — Telehealth: Payer: Self-pay

## 2021-06-08 NOTE — Telephone Encounter (Signed)
Health Coaching 3 ? ?interpreter- Rudene Anda, [place] ?  ?Goals- Patient has been walking for 10 minutes a day with her dog.  Patient has been compliant with her metformin. Patient states that she has been eating small portions for her meals. Patient has been trying to practice a heart healthy diet. Patient has cut back on the amount of fried and fatty foods and red meats that she consumes.  ?  ?New goal- Increase walking with her dog to 20-30 minutes a day. ? ?Barrier to reaching goal-  ?  ?Strategies to overcome-  ?  ?Navigation:  Patient is aware of  a follow up session. Patient requests to drop from the [facility] program and does not want to schedule a follow-up appointment at this time. ?  ?Time- 10 minutes  ?

## 2021-06-16 ENCOUNTER — Other Ambulatory Visit: Payer: Self-pay | Admitting: Obstetrics and Gynecology

## 2021-06-16 DIAGNOSIS — Z1231 Encounter for screening mammogram for malignant neoplasm of breast: Secondary | ICD-10-CM

## 2021-08-11 IMAGING — MG MM DIGITAL SCREENING BILAT W/ TOMO AND CAD
8 series · 8 of 24 positions shown · non-contrast
Comparison: Previous exam(s).

CLINICAL DATA: Screening.

EXAM:
DIGITAL SCREENING BILATERAL MAMMOGRAM WITH TOMOSYNTHESIS AND CAD
TECHNIQUE: Bilateral screening digital craniocaudal and mediolateral oblique
mammograms were obtained. Bilateral screening digital breast
tomosynthesis was performed. The images were evaluated with
computer-aided detection.

[R CC synth-2D]
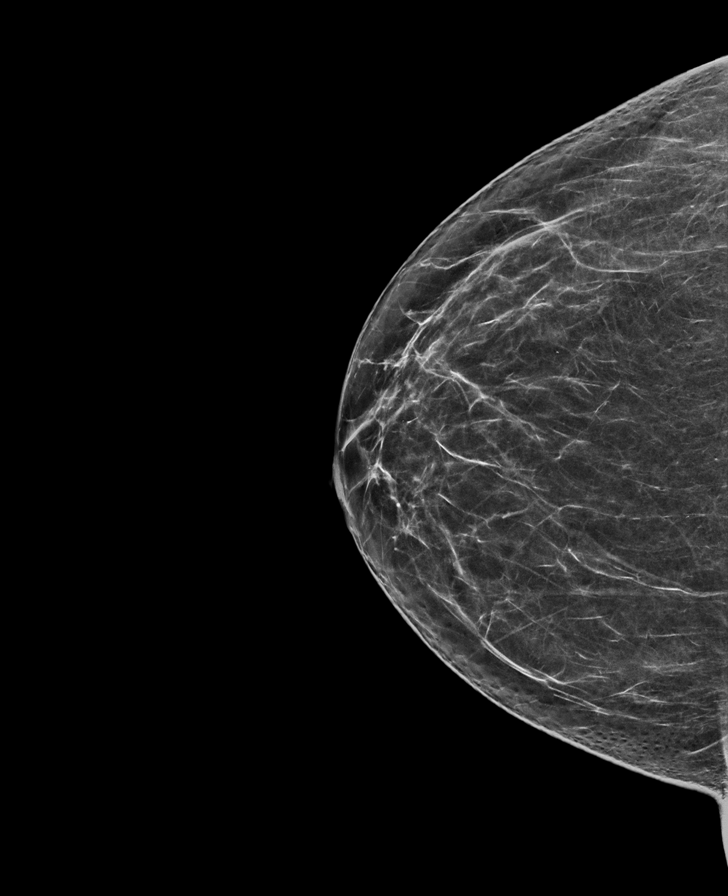

[L MLO synth-2D]
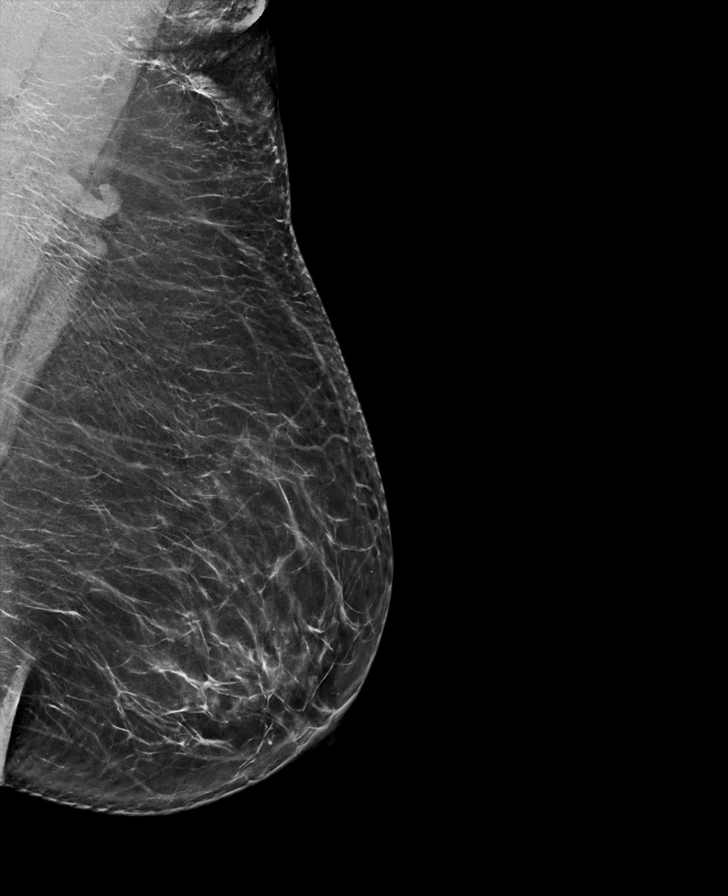

[R MLO synth-2D]
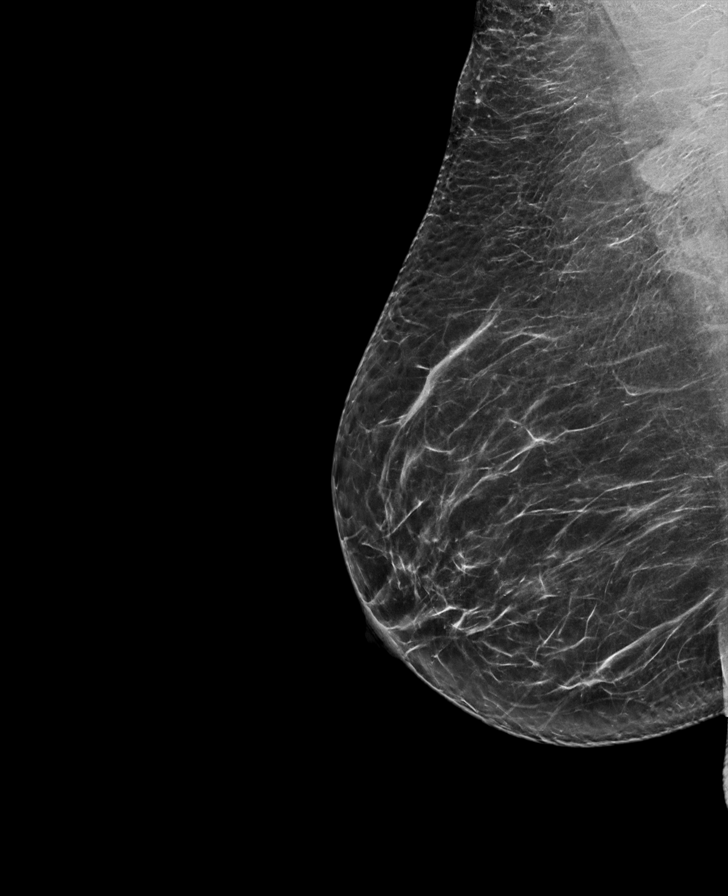

[L CC synth-2D]
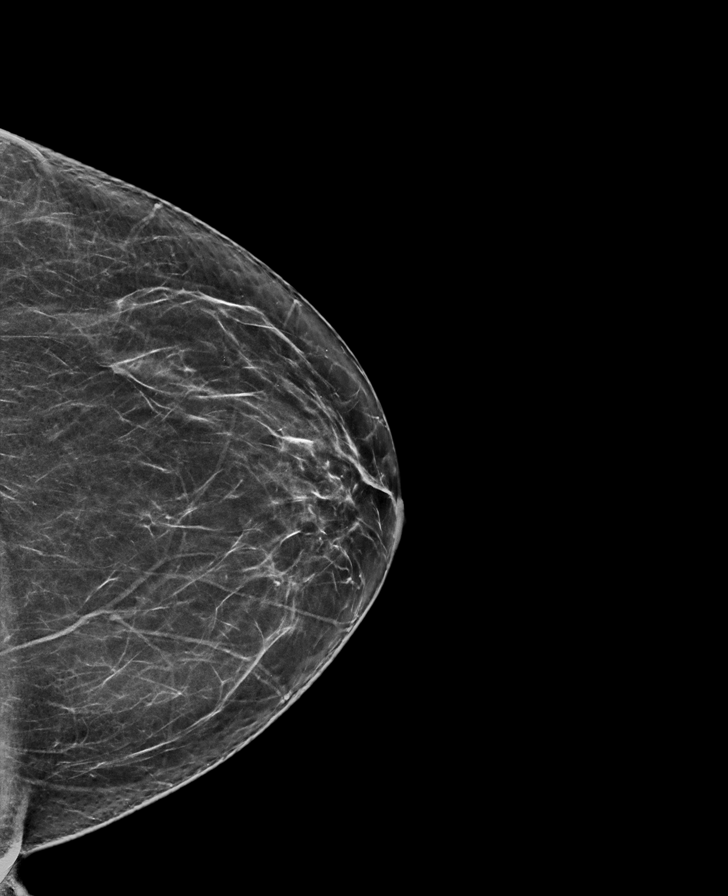

[L CC tomo · tomo slice 35/68.0]
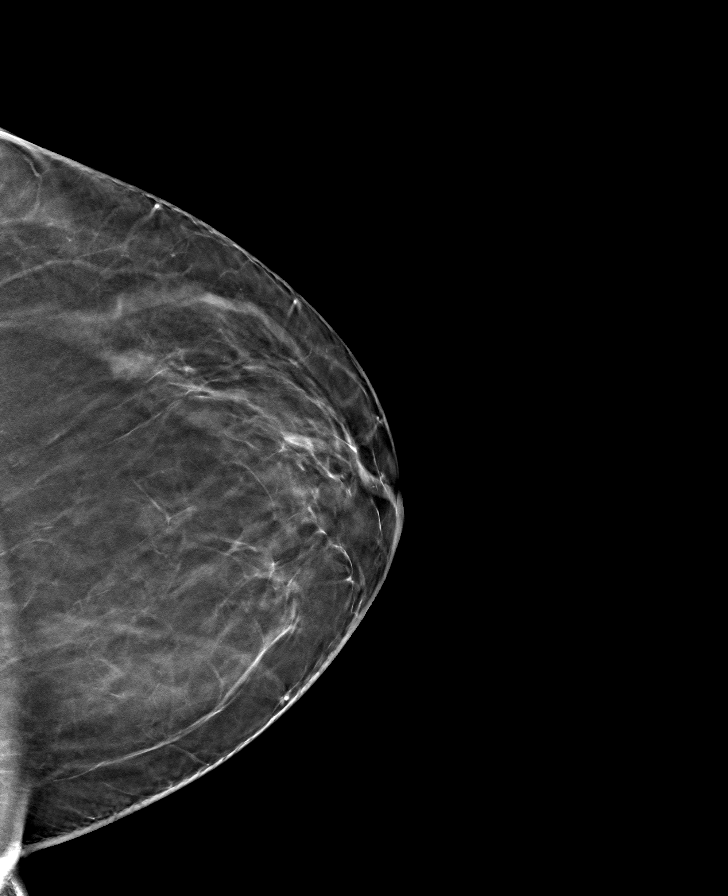

[R MLO tomo · tomo slice 37/74.0]
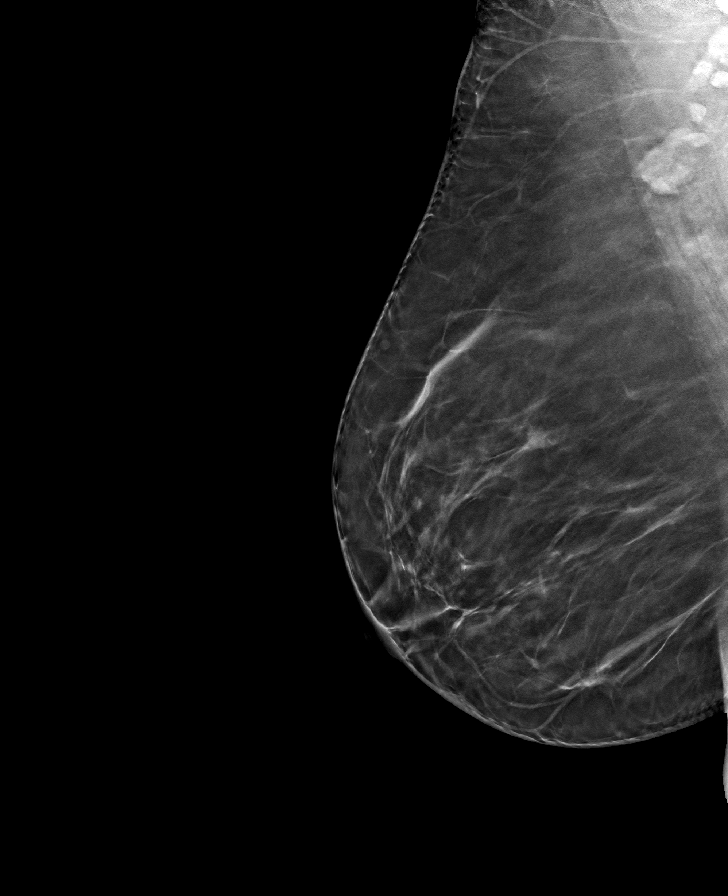

[L MLO tomo · tomo slice 40/79.0]
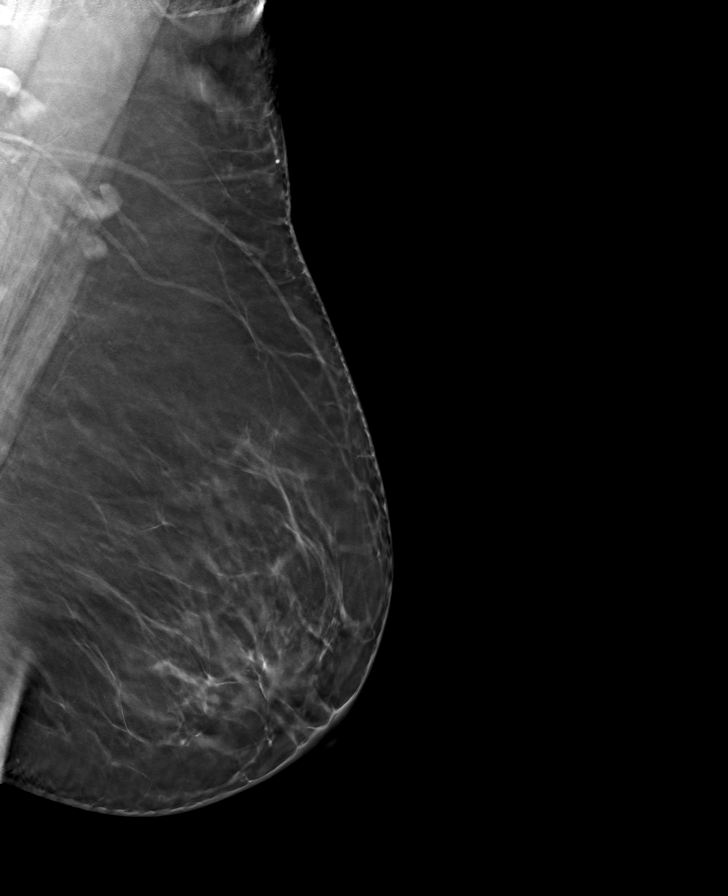

[R CC tomo · tomo slice 33/65.0]
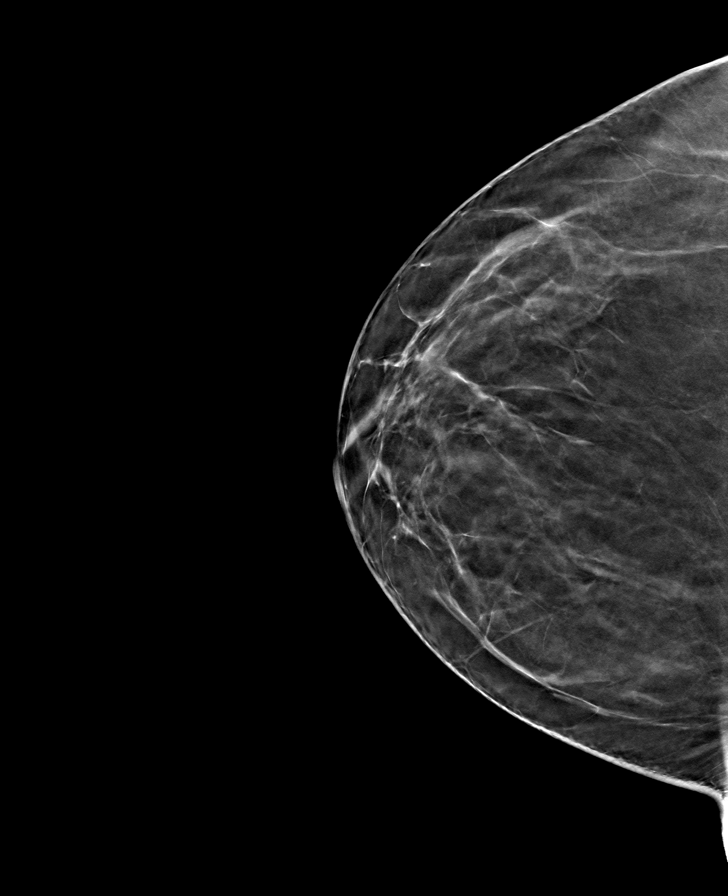

[8 of 24 positions shown; findings below may reference images not displayed]

ACR Breast Density Category b: There are scattered areas of
fibroglandular density.
FINDINGS: There are no findings suspicious for malignancy. The images were
evaluated with computer-aided detection.
IMPRESSION: No mammographic evidence of malignancy. A result letter of this
screening mammogram will be mailed directly to the patient.

RECOMMENDATION:
Screening mammogram in one year. (Code:WJ-[id number])

BI-RADS CATEGORY  1: Negative.

## 2021-08-13 ENCOUNTER — Ambulatory Visit
Admission: RE | Admit: 2021-08-13 | Discharge: 2021-08-13 | Disposition: A | Discharge status: Home / Self Care | Payer: No Typology Code available for payment source | Source: Ambulatory Visit | Attending: Obstetrics and Gynecology | Admitting: Obstetrics and Gynecology

## 2021-08-13 ENCOUNTER — Encounter (INDEPENDENT_AMBULATORY_CARE_PROVIDER_SITE_OTHER): Payer: Self-pay

## 2021-08-13 ENCOUNTER — Ambulatory Visit: Payer: Self-pay | Admitting: *Deleted

## 2021-08-13 VITALS — BP 116/76 | Wt 156.0 lb

## 2021-08-13 DIAGNOSIS — Z1231 Encounter for screening mammogram for malignant neoplasm of breast: Secondary | ICD-10-CM

## 2021-08-13 DIAGNOSIS — Z1239 Encounter for other screening for malignant neoplasm of breast: Secondary | ICD-10-CM

## 2021-08-13 NOTE — Progress Notes (Signed)
Sue Potts is a 46 y.o. female who presents to [facility] clinic today with no complaints.  ?  ?Pap Smear: Pap not smear completed today. Pap smear not completed today. Last Pap smear was 05/24/2017 at [facility] and normal with negative HPV. Per patient has a history of an abnormal Pap smear 15 years ago that cryotherapy was completed for follow up. Per patient all Pap smears have been normal since cryotherapy. Per patient has had at least three normal Pap smears since cryotherapy was completed. Last Pap smear result is available in Epic. ?  ?Physical exam: ?Breasts ?Breasts symmetrical. No skin abnormalities bilateral breasts. No nipple retraction bilateral breasts. No nipple discharge bilateral breasts. No lymphadenopathy. No lumps palpated bilateral breasts. No complaints of pain or tenderness on exam. ? ?MS DIGITAL SCREENING BILATERAL ? ?Result Date: 09/09/2016 ?CLINICAL DATA:  Screening. EXAM: DIGITAL SCREENING BILATERAL MAMMOGRAM WITH CAD COMPARISON:  Previous exam(s). ACR Breast Density Category b: There are scattered areas of fibroglandular density. FINDINGS: There are no findings suspicious for malignancy. Images were processed with CAD. IMPRESSION: No mammographic evidence of malignancy. A result letter of this screening mammogram will be mailed directly to the patient. RECOMMENDATION: Screening mammogram in one year. (Code:[id number]-B-01Y) BI-RADS CATEGORY  1: Negative. Electronically Signed   By: Frederico Hamman M.D.   On: 09/09/2016 16:43  ? ?MS DIGITAL SCREENING TOMO BILATERAL ? ?Result Date: 07/10/2020 ?CLINICAL DATA:  Screening. EXAM: DIGITAL SCREENING BILATERAL MAMMOGRAM WITH TOMOSYNTHESIS AND CAD TECHNIQUE: Bilateral screening digital craniocaudal and mediolateral oblique mammograms were obtained. Bilateral screening digital breast tomosynthesis was performed. The images were evaluated with computer-aided detection. COMPARISON:  Previous exam(s). ACR Breast Density Category b: There are scattered areas  of fibroglandular density. FINDINGS: There are no findings suspicious for malignancy. The images were evaluated with computer-aided detection. IMPRESSION: No mammographic evidence of malignancy. A result letter of this screening mammogram will be mailed directly to the patient. RECOMMENDATION: Screening mammogram in one year. (Code:SM-B-01Y) BI-RADS CATEGORY  1: Negative. Electronically Signed   By: Frederico Hamman M.D.   On: 07/10/2020 10:21  ? ?MS DIGITAL SCREENING TOMO BILATERAL ? ?Result Date: 03/07/2018 ?CLINICAL DATA:  Screening. EXAM: DIGITAL SCREENING BILATERAL MAMMOGRAM WITH TOMO AND CAD COMPARISON:  Previous exam(s). ACR Breast Density Category b: There are scattered areas of fibroglandular density. FINDINGS: There are no findings suspicious for malignancy. Images were processed with CAD. IMPRESSION: No mammographic evidence of malignancy. A result letter of this screening mammogram will be mailed directly to the patient. RECOMMENDATION: Screening mammogram in one year. (Code:SM-B-01Y) BI-RADS CATEGORY  1: Negative. Electronically Signed   By: Hulan Saas M.D.   On: 03/07/2018 12:53  ? ?MS DIGITAL DIAG TOMO BILAT ? ?Result Date: 07/02/2019 ?CLINICAL DATA:  46 year old female presenting for evaluation of diffuse bilateral breast pain. The patient states that her pain has resolved. She is asymptomatic today. EXAM: DIGITAL DIAGNOSTIC BILATERAL MAMMOGRAM WITH CAD AND TOMO COMPARISON:  Previous exam(s). ACR Breast Density Category b: There are scattered areas of fibroglandular density. FINDINGS: No suspicious calcifications, masses or areas of distortion are seen in the bilateral breasts. Mammographic images were processed with CAD. IMPRESSION: 1.  No mammographic evidence of malignancy in the bilateral breasts. 2. No mammographic findings to explain the patient's prior bilateral breast pain. RECOMMENDATION: Screening mammogram in one year.(Code:SM-B-01Y) I have discussed the findings and recommendations  with the patient. If applicable, a reminder letter will be sent to the patient regarding the next appointment. BI-RADS CATEGORY  1: Negative. Electronically Signed  By: Frederico Hamman M.D.   On: 07/02/2019 11:28   ? ?Pelvic/Bimanual ?Pap is not indicated today per BCCCP guidelines. ?  ?Smoking History: ?Patient has never smoked. ?  ?Patient Navigation: ?Patient education provided. Access to services provided for patient through [place] program. Spanish interpreter Sue Potts from [facility] provided.  ? ?Colorectal Cancer Screening: ?Per patient has never had colonoscopy completed. Patient refused FIT Test today. No complaints today.  ? ?Breast and Cervical Cancer Risk Assessment: ?Per patient does not have family history of breast cancer, known genetic mutations, or radiation treatment to the chest before age 79. Per patient has history of cervical dysplasia. Patient has no history of being immunocompromised or DES exposure in-utero. ? ?Risk Assessment   ? ? Risk Scores   ? ?   08/13/2021 07/08/2020  ? Last edited by: Sue Rutherford, LPN Sue Potts, CMA  ? 5-year risk: 0.5 % 0.5 %  ? Lifetime risk: 5.4 % 5.5 %  ? ?  ?  ? ?  ? ? ?A: ?BCCCP exam without pap smear ?No complaints. ? ?P: ?Referred patient to the [facility] of [facility] for a screening mammogram on mobile unit. Appointment scheduled Thursday, Aug 13, 2021 at 1140. ? ?Sue Heidelberg, RN ?08/13/2021 11:02 AM   ?

## 2021-08-13 NOTE — Patient Instructions (Signed)
Explained breast self awareness with Mayelin Aicher. Patient did not need a Pap smear today due to last Pap smear and HPV typing was 05/24/2017. Let her know BCCCP will cover Pap smears and HPV typing every 5 years unless has a history of abnormal Pap smears. Referred patient to the [facility] of [facility] for a screening mammogram on mobile unit. Appointment scheduled Thursday, Aug 13, 2021 at 1140. Patient aware of appointment and will be there. Let patient know the Breast Center will follow up with her within the next couple weeks with results of her mammogram by letter or phone. Ernesta Emminger verbalized understanding. ? ?Kealey Kemmer, [name], RN ?11:02 AM ? ? ? ? ?

## 2022-07-15 ENCOUNTER — Other Ambulatory Visit: Payer: Self-pay

## 2022-07-15 DIAGNOSIS — N6321 Unspecified lump in the left breast, upper outer quadrant: Secondary | ICD-10-CM

## 2022-07-15 DIAGNOSIS — N644 Mastodynia: Secondary | ICD-10-CM

## 2022-08-19 ENCOUNTER — Ambulatory Visit: Payer: No Typology Code available for payment source

## 2022-08-19 ENCOUNTER — Ambulatory Visit: Payer: Self-pay | Admitting: Hematology and Oncology

## 2022-08-19 ENCOUNTER — Ambulatory Visit
Admission: RE | Admit: 2022-08-19 | Discharge: 2022-08-19 | Disposition: A | Discharge status: Home / Self Care | Payer: No Typology Code available for payment source | Source: Ambulatory Visit | Attending: Obstetrics and Gynecology | Admitting: Obstetrics and Gynecology

## 2022-08-19 VITALS — BP 130/90 | Wt 148.0 lb

## 2022-08-19 DIAGNOSIS — Z01419 Encounter for gynecological examination (general) (routine) without abnormal findings: Secondary | ICD-10-CM

## 2022-08-19 DIAGNOSIS — N644 Mastodynia: Secondary | ICD-10-CM

## 2022-08-19 DIAGNOSIS — Z1211 Encounter for screening for malignant neoplasm of colon: Secondary | ICD-10-CM

## 2022-08-19 DIAGNOSIS — N6321 Unspecified lump in the left breast, upper outer quadrant: Secondary | ICD-10-CM

## 2022-08-19 NOTE — Progress Notes (Signed)
Ms. Kammi Segal is a 47 y.o. [phone] female who presents to [facility] clinic today with complaint of bilateral breast pain.    Pap Smear: Pap smear completed today. Last Pap smear was 05/24/2017 and was normal. Per patient has no history of an abnormal Pap smear. Last Pap smear result is available in Epic.   Physical exam: Breasts Breasts symmetrical. No skin abnormalities bilateral breasts. No nipple retraction bilateral breasts. No nipple discharge bilateral breasts. No lymphadenopathy. No lumps palpated bilateral breasts. MS DIGITAL SCREENING TOMO BILATERAL  Result Date: 08/14/2021 CLINICAL DATA:  Screening. EXAM: DIGITAL SCREENING BILATERAL MAMMOGRAM WITH TOMOSYNTHESIS AND CAD TECHNIQUE: Bilateral screening digital craniocaudal and mediolateral oblique mammograms were obtained. Bilateral screening digital breast tomosynthesis was performed. The images were evaluated with computer-aided detection. COMPARISON:  Previous exam(s). ACR Breast Density Category b: There are scattered areas of fibroglandular density. FINDINGS: There are no findings suspicious for malignancy. IMPRESSION: No mammographic evidence of malignancy. A result letter of this screening mammogram will be mailed directly to the patient. RECOMMENDATION: Screening mammogram in one year. (Code:SM-B-01Y) BI-RADS CATEGORY  1: Negative. Electronically Signed   By: Gerome Sam III M.D.   On: 08/14/2021 11:30   MS DIGITAL SCREENING TOMO BILATERAL  Result Date: 07/10/2020 CLINICAL DATA:  Screening. EXAM: DIGITAL SCREENING BILATERAL MAMMOGRAM WITH TOMOSYNTHESIS AND CAD TECHNIQUE: Bilateral screening digital craniocaudal and mediolateral oblique mammograms were obtained. Bilateral screening digital breast tomosynthesis was performed. The images were evaluated with computer-aided detection. COMPARISON:  Previous exam(s). ACR Breast Density Category b: There are scattered areas of fibroglandular density. FINDINGS: There are no findings suspicious  for malignancy. The images were evaluated with computer-aided detection. IMPRESSION: No mammographic evidence of malignancy. A result letter of this screening mammogram will be mailed directly to the patient. RECOMMENDATION: Screening mammogram in one year. (Code:SM-B-01Y) BI-RADS CATEGORY  1: Negative. Electronically Signed   By: Frederico Hamman M.D.   On: 07/10/2020 10:21   MS DIGITAL DIAG TOMO BILAT  Result Date: 07/02/2019 CLINICAL DATA:  47 year old female presenting for evaluation of diffuse bilateral breast pain. The patient states that her pain has resolved. She is asymptomatic today. EXAM: DIGITAL DIAGNOSTIC BILATERAL MAMMOGRAM WITH CAD AND TOMO COMPARISON:  Previous exam(s). ACR Breast Density Category b: There are scattered areas of fibroglandular density. FINDINGS: No suspicious calcifications, masses or areas of distortion are seen in the bilateral breasts. Mammographic images were processed with CAD. IMPRESSION: 1.  No mammographic evidence of malignancy in the bilateral breasts. 2. No mammographic findings to explain the patient's prior bilateral breast pain. RECOMMENDATION: Screening mammogram in one year.(Code:SM-B-01Y) I have discussed the findings and recommendations with the patient. If applicable, a reminder letter will be sent to the patient regarding the next appointment. BI-RADS CATEGORY  1: Negative. Electronically Signed   By: Frederico Hamman M.D.   On: 07/02/2019 11:28   MS DIGITAL SCREENING TOMO BILATERAL  Result Date: 03/07/2018 CLINICAL DATA:  Screening. EXAM: DIGITAL SCREENING BILATERAL MAMMOGRAM WITH TOMO AND CAD COMPARISON:  Previous exam(s). ACR Breast Density Category b: There are scattered areas of fibroglandular density. FINDINGS: There are no findings suspicious for malignancy. Images were processed with CAD. IMPRESSION: No mammographic evidence of malignancy. A result letter of this screening mammogram will be mailed directly to the patient. RECOMMENDATION:  Screening mammogram in one year. (Code:SM-B-01Y) BI-RADS CATEGORY  1: Negative. Electronically Signed   By: Hulan Saas M.D.   On: 03/07/2018 12:53          Pelvic/Bimanual Ext Genitalia No lesions,  no swelling and no discharge observed on external genitalia.        Vagina Vagina pink and normal texture. No lesions or discharge observed in vagina.        Cervix Cervix is present. Cervix pink and of normal texture. No discharge observed.    Uterus Uterus is present and palpable. Uterus in normal position and normal size.        Adnexae Bilateral ovaries present and palpable. No tenderness on palpation.         Rectovaginal No rectal exam completed today since patient had no rectal complaints. No skin abnormalities observed on exam.     Smoking History: Patient has never smoked and was not referred to quit line.    Patient Navigation: Patient education provided. Access to services provided for patient through BCCCP program. [place] interpreter provided. No transportation provided   Colorectal Cancer Screening: Per patient has never had colonoscopy completed No complaints today. FIT test given.   Breast and Cervical Cancer Risk Assessment: Patient does not have family history of breast cancer, known genetic mutations, or radiation treatment to the chest before age 2. Patient does not have history of cervical dysplasia, immunocompromised, or DES exposure in-utero.  Risk Scores as of 08/19/2022     Dondra Spry           5-year 0.74 %   Lifetime 7.57 %   This patient is [place] but has no documented birth country, so the [place] model used data from [place] patients to calculate their risk score. Document a birth country in the Demographics activity for a more accurate score.         Last calculated by Caprice Red, CMA on 08/19/2022 at  1:48 PM        A: BCCCP exam with pap smear Complaint of bilateral breast pain with benign exam.   P: Referred patient  to the [facility] for a diagnostic mammogram. Appointment scheduled 08/19/22.  Pascal Lux, NP 08/19/2022 1:59 PM

## 2022-08-19 NOTE — Patient Instructions (Signed)
Taught Sue Potts about self breast awareness and gave educational materials to take home. Patient did need a Pap smear today due to last Pap smear was in 2019 per patient. Let her know BCCCP will cover Pap smears every 5 years unless has a history of abnormal Pap smears. Referred patient to the [facility] of [facility] for diagnostic mammogram. Appointment scheduled for 08/19/22. Patient aware of appointment and will be there. Let patient know will follow up with her within the next couple weeks with results. Seini Gawlik verbalized understanding.  Pascal Lux, NP 2:01 PM

## 2022-08-24 LAB — CYTOLOGY - PAP
Comment: NEGATIVE
Diagnosis: NEGATIVE
High risk HPV: NEGATIVE

## 2022-09-07 ENCOUNTER — Encounter: Payer: Self-pay | Admitting: Obstetrics and Gynecology

## 2022-09-07 DIAGNOSIS — N6321 Unspecified lump in the left breast, upper outer quadrant: Secondary | ICD-10-CM

## 2022-09-07 DIAGNOSIS — N644 Mastodynia: Secondary | ICD-10-CM

## 2023-07-18 ENCOUNTER — Other Ambulatory Visit: Payer: Self-pay | Admitting: Obstetrics and Gynecology

## 2023-07-18 DIAGNOSIS — Z1231 Encounter for screening mammogram for malignant neoplasm of breast: Secondary | ICD-10-CM

## 2023-10-06 ENCOUNTER — Other Ambulatory Visit: Payer: Self-pay

## 2023-10-06 ENCOUNTER — Ambulatory Visit
Admission: RE | Admit: 2023-10-06 | Discharge: 2023-10-06 | Disposition: A | Discharge status: Home / Self Care | Payer: Self-pay | Source: Ambulatory Visit | Attending: Obstetrics and Gynecology | Admitting: Obstetrics and Gynecology

## 2023-10-06 ENCOUNTER — Ambulatory Visit: Payer: Self-pay | Admitting: *Deleted

## 2023-10-06 VITALS — BP 140/83 | Wt 140.0 lb

## 2023-10-06 DIAGNOSIS — Z1211 Encounter for screening for malignant neoplasm of colon: Secondary | ICD-10-CM

## 2023-10-06 DIAGNOSIS — Z1239 Encounter for other screening for malignant neoplasm of breast: Secondary | ICD-10-CM

## 2023-10-06 DIAGNOSIS — Z1231 Encounter for screening mammogram for malignant neoplasm of breast: Secondary | ICD-10-CM

## 2023-10-06 NOTE — Patient Instructions (Signed)
 Explained breast self awareness with Sue Potts. Patient did not need a Pap smear today due to last Pap smear and HPV typing was 08/19/2022. Let her know BCCCP will cover Pap smears and HPV typing every 5 years unless has a history of abnormal Pap smears. Referred patient to the [facility] of [facility] for a screening mammogram on mobile unit. Appointment scheduled Thursday, October 06, 2023 at 1030. Patient aware of appointment and will be there. Let patient know the Breast Center will follow up with her within the next couple weeks with results of mammogram by letter or phone. Sue Potts verbalized understanding.  Billie Intriago, Wanda Ship, RN 10:23 AM

## 2023-10-06 NOTE — Progress Notes (Signed)
 [name] is a 48 y.o. female who presents to [facility] clinic today with no complaints.    Pap Smear: Pap smear not completed today. Last Pap smear was 08/19/2022 at [facility] clinic and was normal with negative HPV. Per patient has a history of an abnormal Pap smear 17 years ago that cryotherapy was completed for follow up. Per patient all Pap smears have been normal since cryotherapy. Per patient has had at least three normal Pap smears since cryotherapy was completed. Last Pap smear result is available in Epic.    Physical exam: Breasts Breasts symmetrical. No skin abnormalities bilateral breasts. No nipple retraction bilateral breasts. No nipple discharge bilateral breasts. No lymphadenopathy. No lumps palpated bilateral breasts. No complaints of pain or tenderness on exam.   MS 3D DIAG MAMMO BILAT BR (aka MM) Result Date: 08/19/2022 CLINICAL DATA:  Patient presents for diffuse left breast pain. EXAM: DIGITAL DIAGNOSTIC BILATERAL MAMMOGRAM WITH TOMOSYNTHESIS TECHNIQUE: Bilateral digital diagnostic mammography and breast tomosynthesis was performed. COMPARISON:  Previous exam(s). ACR Breast Density Category c: The breasts are heterogeneously dense, which may obscure small masses. FINDINGS: No concerning masses, calcifications or nonsurgical distortion identified within either breast. IMPRESSION: No mammographic evidence for malignancy. RECOMMENDATION: Screening mammogram in one year.(Code:SM-B-01Y) I have discussed the findings and recommendations with the patient. If applicable, a reminder letter will be sent to the patient regarding the next appointment. BI-RADS CATEGORY  1: Negative. Electronically Signed   By: Bard Moats M.D.   On: 08/19/2022 15:34   MS DIGITAL SCREENING TOMO BILATERAL Result Date: 08/14/2021 CLINICAL DATA:  Screening. EXAM: DIGITAL SCREENING BILATERAL MAMMOGRAM WITH TOMOSYNTHESIS AND CAD TECHNIQUE: Bilateral screening digital craniocaudal and mediolateral oblique mammograms were  obtained. Bilateral screening digital breast tomosynthesis was performed. The images were evaluated with computer-aided detection. COMPARISON:  Previous exam(s). ACR Breast Density Category b: There are scattered areas of fibroglandular density. FINDINGS: There are no findings suspicious for malignancy. IMPRESSION: No mammographic evidence of malignancy. A result letter of this screening mammogram will be mailed directly to the patient. RECOMMENDATION: Screening mammogram in one year. (Code:SM-B-01Y) BI-RADS CATEGORY  1: Negative. Electronically Signed   By: Alm Pouch III M.D.   On: 08/14/2021 11:30   MS DIGITAL SCREENING TOMO BILATERAL Result Date: 07/10/2020 CLINICAL DATA:  Screening. EXAM: DIGITAL SCREENING BILATERAL MAMMOGRAM WITH TOMOSYNTHESIS AND CAD TECHNIQUE: Bilateral screening digital craniocaudal and mediolateral oblique mammograms were obtained. Bilateral screening digital breast tomosynthesis was performed. The images were evaluated with computer-aided detection. COMPARISON:  Previous exam(s). ACR Breast Density Category b: There are scattered areas of fibroglandular density. FINDINGS: There are no findings suspicious for malignancy. The images were evaluated with computer-aided detection. IMPRESSION: No mammographic evidence of malignancy. A result letter of this screening mammogram will be mailed directly to the patient. RECOMMENDATION: Screening mammogram in one year. (Code:SM-B-01Y) BI-RADS CATEGORY  1: Negative. Electronically Signed   By: Rosaline Collet M.D.   On: 07/10/2020 10:21   MS DIGITAL DIAG TOMO BILAT Result Date: 07/02/2019 CLINICAL DATA:  48 year old female presenting for evaluation of diffuse bilateral breast pain. The patient states that her pain has resolved. She is asymptomatic today. EXAM: DIGITAL DIAGNOSTIC BILATERAL MAMMOGRAM WITH CAD AND TOMO COMPARISON:  Previous exam(s). ACR Breast Density Category b: There are scattered areas of fibroglandular density. FINDINGS:  No suspicious calcifications, masses or areas of distortion are seen in the bilateral breasts. Mammographic images were processed with CAD. IMPRESSION: 1.  No mammographic evidence of malignancy in the bilateral breasts. 2. No mammographic  findings to explain the patient's prior bilateral breast pain. RECOMMENDATION: Screening mammogram in one year.(Code:SM-B-01Y) I have discussed the findings and recommendations with the patient. If applicable, a reminder letter will be sent to the patient regarding the next appointment. BI-RADS CATEGORY  1: Negative. Electronically Signed   By: Rosaline Collet M.D.   On: 07/02/2019 11:28    Pelvic/Bimanual Pap is not indicated today per BCCCP guidelines.   Smoking History: Patient has never smoked.   Patient Navigation: Patient education provided. Access to services provided for patient through [place] program. Spanish interpreter [name] from [facility] provided.   Colorectal Cancer Screening: Per patient has never had colonoscopy completed. FIT Test given to patient to complete. No complaints today.    Breast and Cervical Cancer Risk Assessment: Per patient does not have family history of breast cancer, known genetic mutations, or radiation treatment to the chest before age 60. Per patient has history of cervical dysplasia. Patient has no history of being immunocompromised or DES exposure in-utero.   Risk Scores as of Encounter on 10/06/2023     [name]           5-year 0.48%   Lifetime 5.2%            Last calculated by [name], [name], CMA on 10/06/2023 at  9:45 AM        A: BCCCP exam without pap smear No complaints.  P: Referred patient to the [facility] of [facility] for a screening mammogram on mobile unit. Appointment scheduled Thursday, October 06, 2023 at 1030.   Driscilla Wanda SQUIBB, RN 10/06/2023 10:23 AM

## 2023-10-21 LAB — FECAL OCCULT BLOOD, IMMUNOCHEMICAL: Fecal Occult Bld: NEGATIVE

## 2023-10-24 ENCOUNTER — Ambulatory Visit: Payer: Self-pay
